# Patient Record
Sex: Male | Born: 1990 | Race: White | Hispanic: No | Marital: Married
Health system: Southern US, Community
[De-identification: ages and names within clinical notes are randomized; demographics above are authoritative.]

---
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
## 2013-05-21 ENCOUNTER — Ambulatory Visit: Payer: Self-pay | Admitting: Physician Assistant

## 2013-05-22 ENCOUNTER — Ambulatory Visit: Payer: Self-pay

## 2013-05-24 ENCOUNTER — Encounter: Payer: Self-pay | Admitting: *Deleted

## 2013-05-26 ENCOUNTER — Other Ambulatory Visit (HOSPITAL_COMMUNITY): Payer: Self-pay | Admitting: Urology

## 2013-05-26 ENCOUNTER — Ambulatory Visit (HOSPITAL_COMMUNITY)
Admission: RE | Admit: 2013-05-26 | Discharge: 2013-05-26 | Disposition: A | Discharge status: Home / Self Care | Payer: BC Managed Care – PPO | Source: Ambulatory Visit | Attending: Urology | Admitting: Urology

## 2013-05-26 DIAGNOSIS — N508 Other specified disorders of male genital organs: Secondary | ICD-10-CM | POA: Insufficient documentation

## 2013-05-26 DIAGNOSIS — N453 Epididymo-orchitis: Secondary | ICD-10-CM

## 2013-05-26 DIAGNOSIS — N433 Hydrocele, unspecified: Secondary | ICD-10-CM | POA: Insufficient documentation

## 2013-06-09 ENCOUNTER — Ambulatory Visit: Payer: Self-pay | Admitting: General Surgery

## 2014-05-03 IMAGING — CT CT PELVIS W/O CM
3 series · 17 of 46 positions shown, 20 images · non-contrast
Comparison: None.

CLINICAL DATA: Left inguinal hernia

EXAM:
CT PELVIS WITHOUT CONTRAST
TECHNIQUE: Multidetector CT imaging of the pelvis was performed following the
standard protocol without intravenous contrast.

[Series 3: soft tissue · axial · 0.66mm/px · z∈[-932,-624]mm · 13 of 171 slices shown, 16 images]
[im 11/171  soft-tissue]
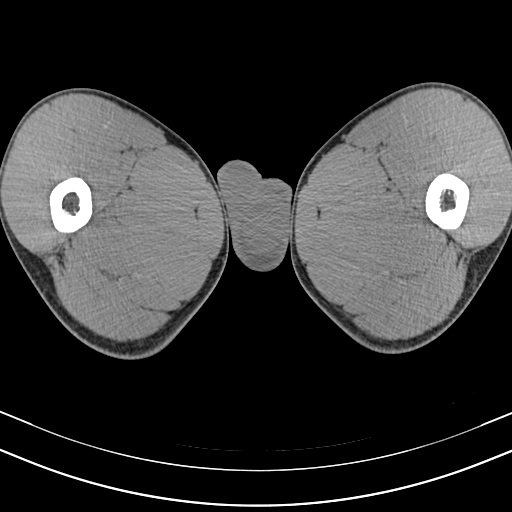
[im 11/171  bone]
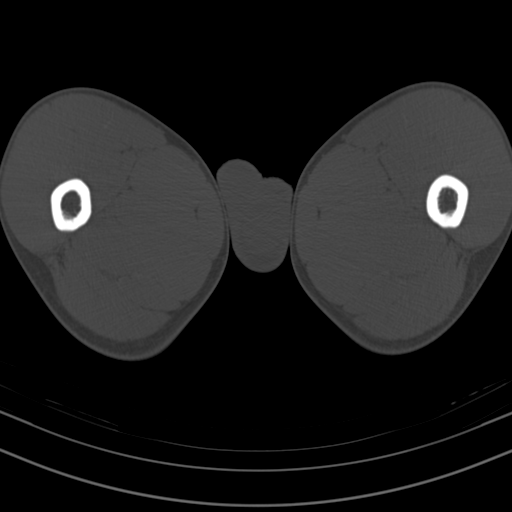
[im 28/171  soft-tissue]
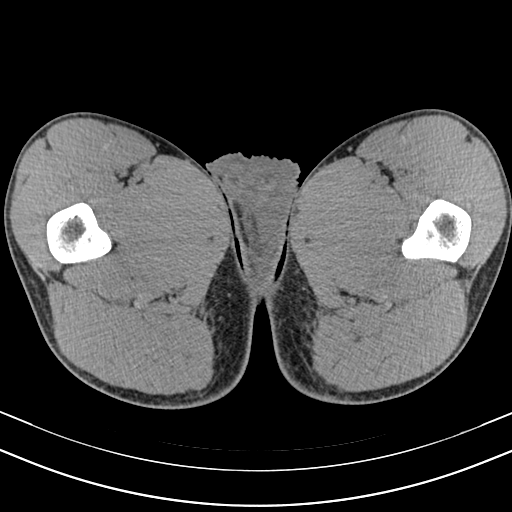
[im 44/171  soft-tissue]
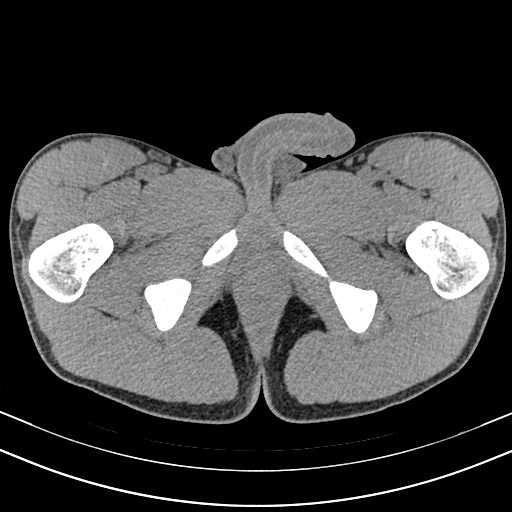
[im 61/171  soft-tissue]
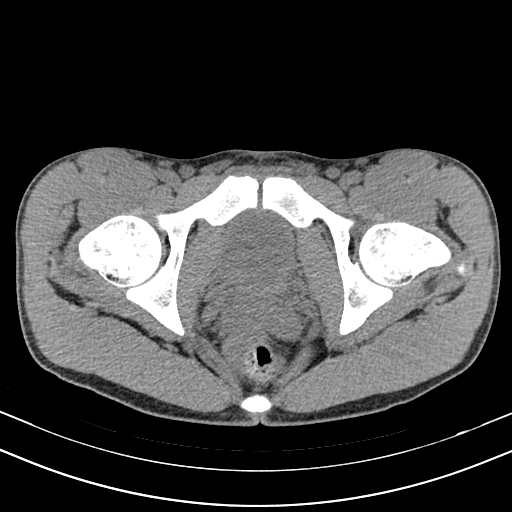
[im 77/171  soft-tissue]
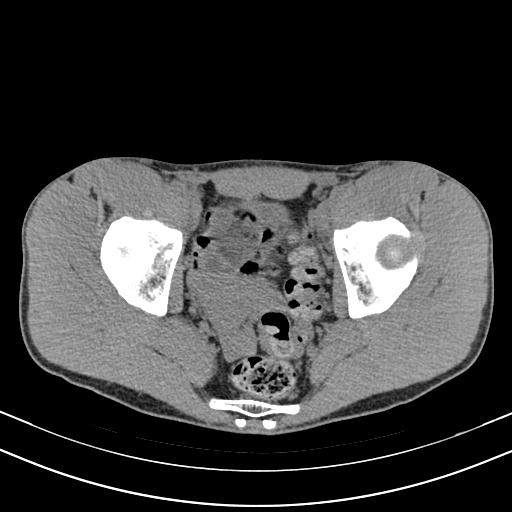
[im 94/171  soft-tissue]
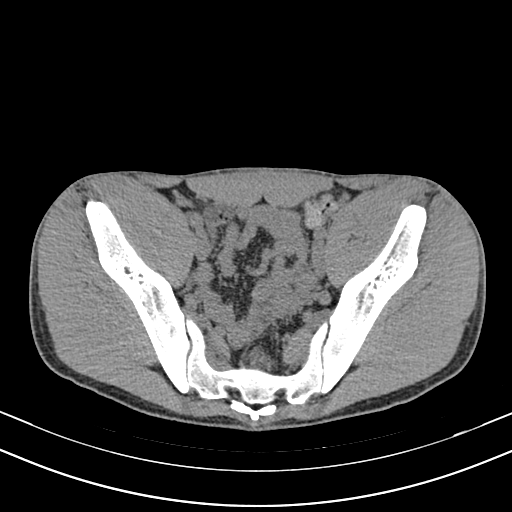
[im 110/171  soft-tissue]
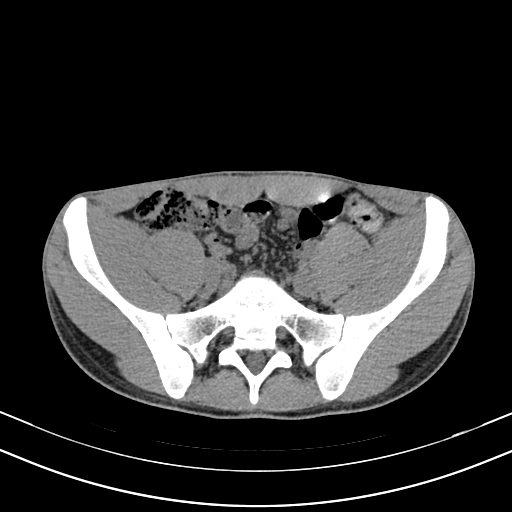
[im 127/171  soft-tissue]
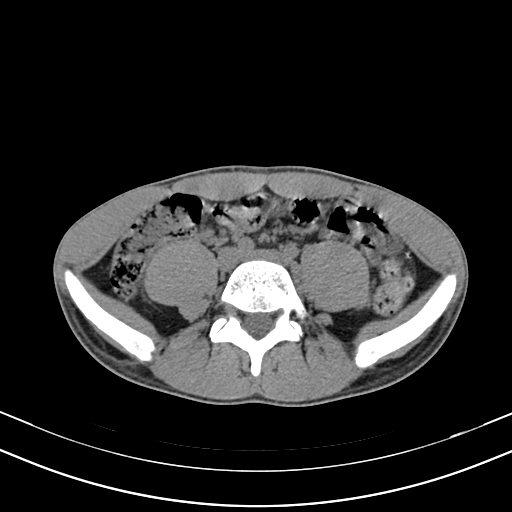
[im 143/171  soft-tissue]
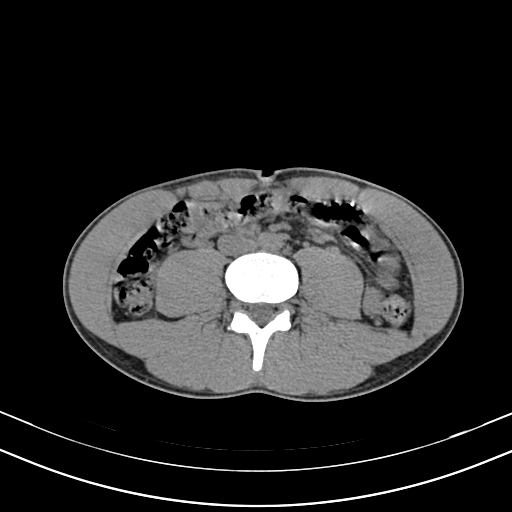
[im 143/171  bone]
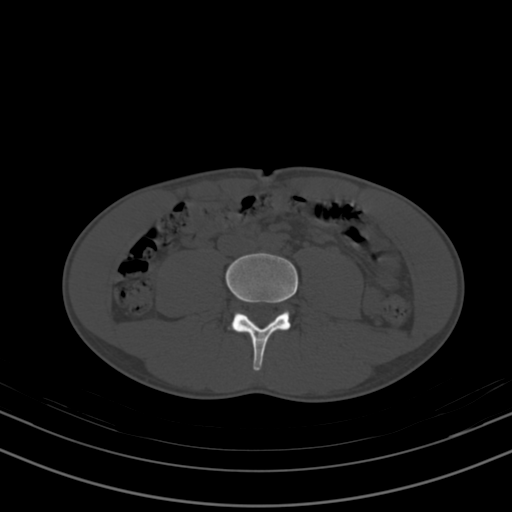
[im 149/171  lung]
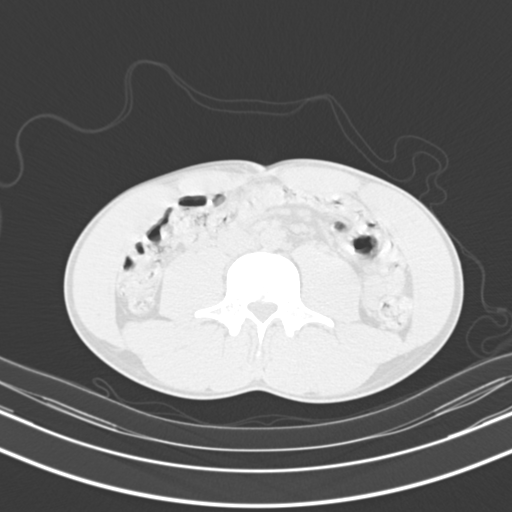
[im 154/171  lung]
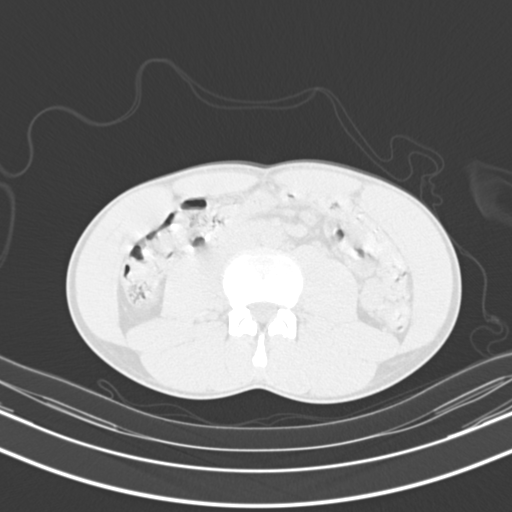
[im 160/171  soft-tissue]
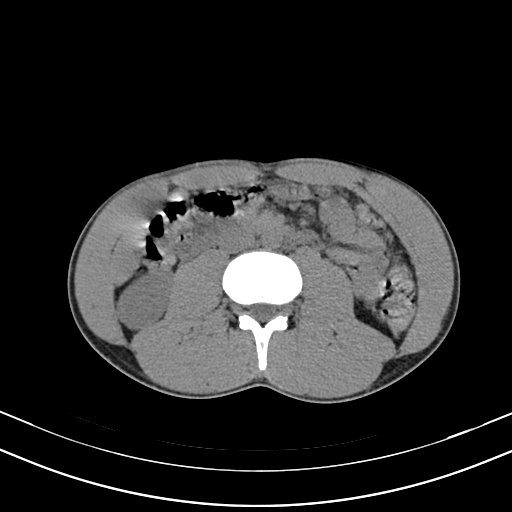
[im 160/171  lung]
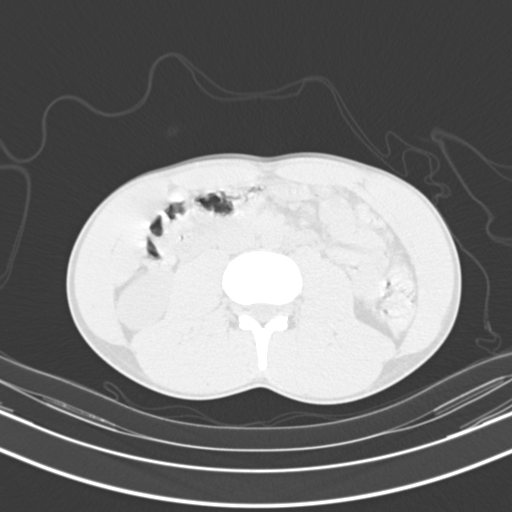
[im 165/171  lung]
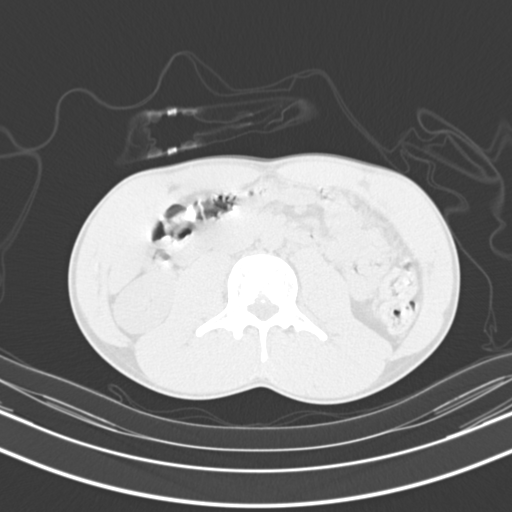

[Series 602: coronal · coronal · 0.67mm/px · 3 of 87 slices shown]
[im 29/87  soft-tissue]
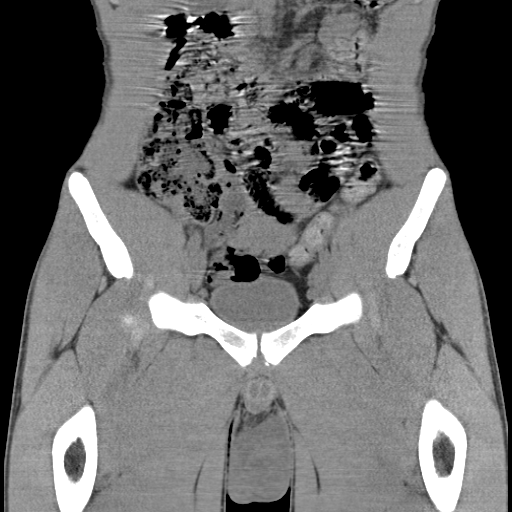
[im 39/87  soft-tissue]
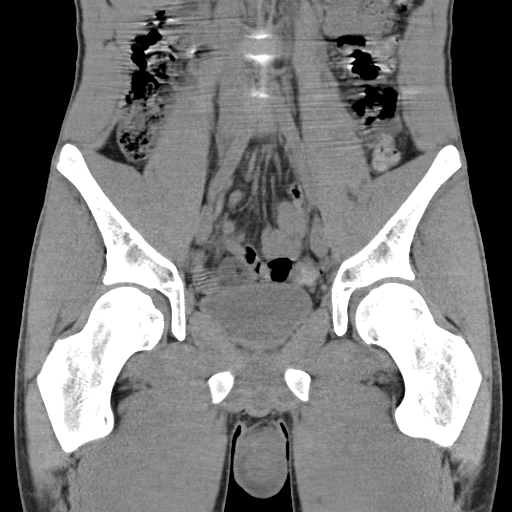
[im 48/87  soft-tissue]
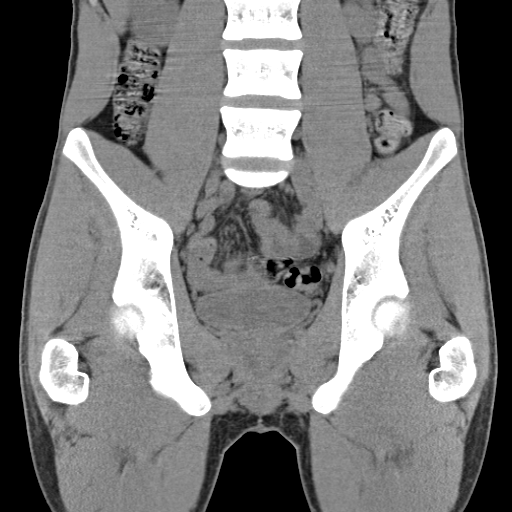

[Series 603: sagittal · sagittal · 0.67mm/px · 1 of 133 slices shown]
[im 45/133  soft-tissue]
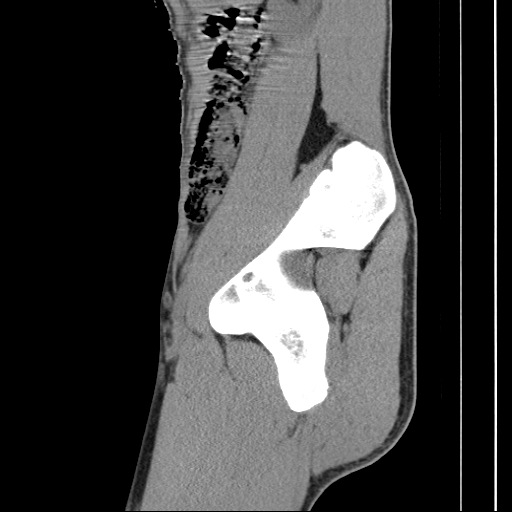

[17 of 46 positions shown; findings below may reference images not displayed]

FINDINGS: No acute fracture. No dislocation. No destructive bone lesion. No
evidence of inguinal hernia. Lack of intravenous and oral contrast
limits evaluation of the intrapelvic and intra-abdominal organs.
Bladder and prostate are grossly within normal limits.
IMPRESSION: No evidence of inguinal hernia.

## 2014-05-07 IMAGING — US US SCROTUM
1 series · 13 of 25 positions shown · non-contrast
Comparison: None.

CLINICAL DATA: Left scrotal swelling

EXAM:
ULTRASOUND OF SCROTUM
TECHNIQUE: Complete ultrasound examination of the testicles, epididymis, and
other scrotal structures was performed.

[Series 1: us scrotum · 0.06mm/px · 13 of 59 slices shown]
[im 1/59]
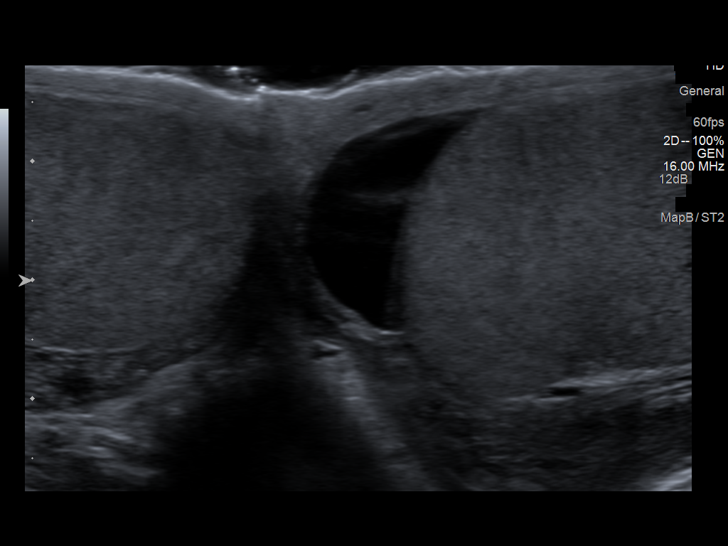
[im 5/59]
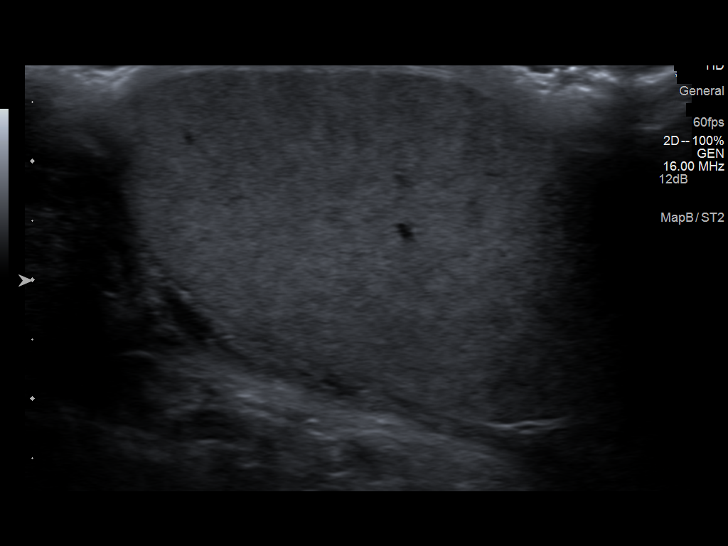
[im 10/59]
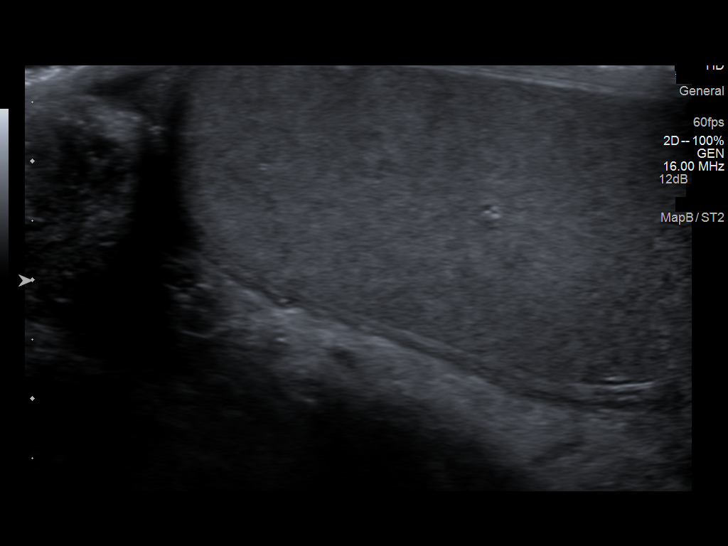
[im 15/59]
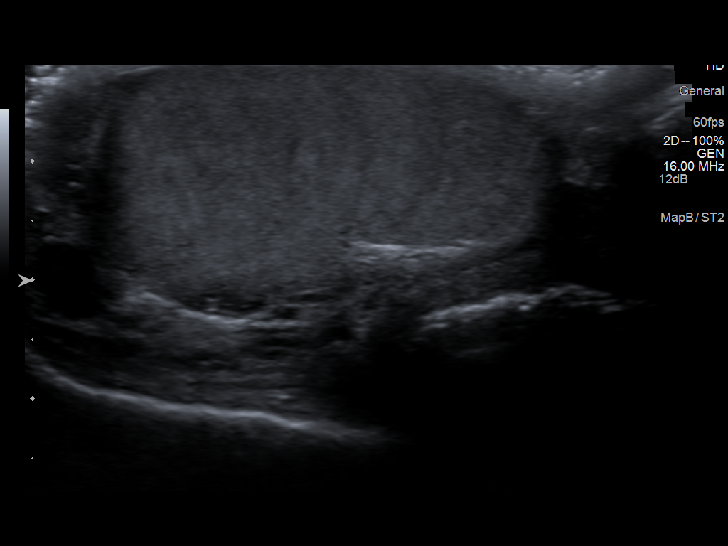
[im 20/59]
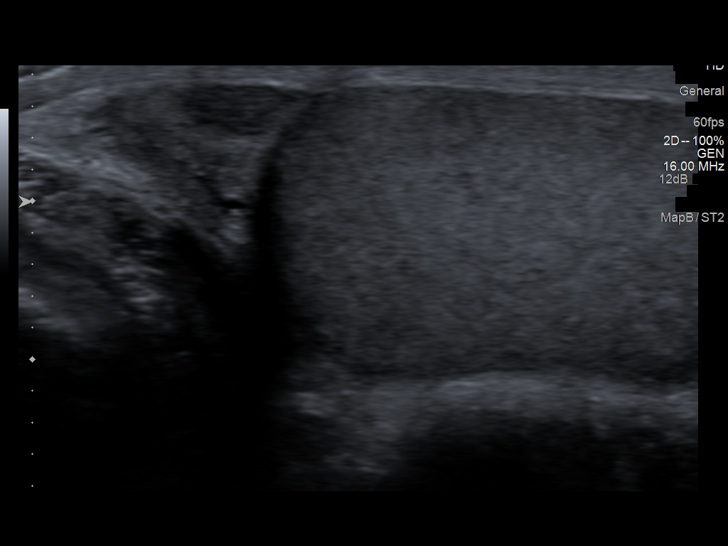
[im 25/59]
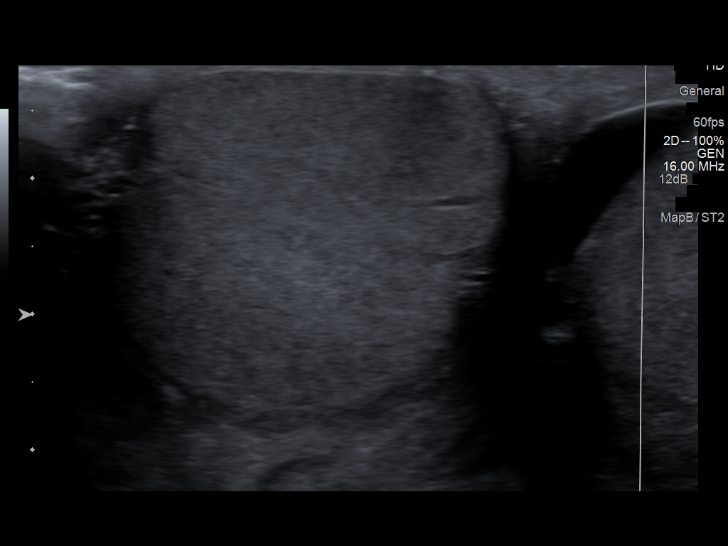
[im 30/59]
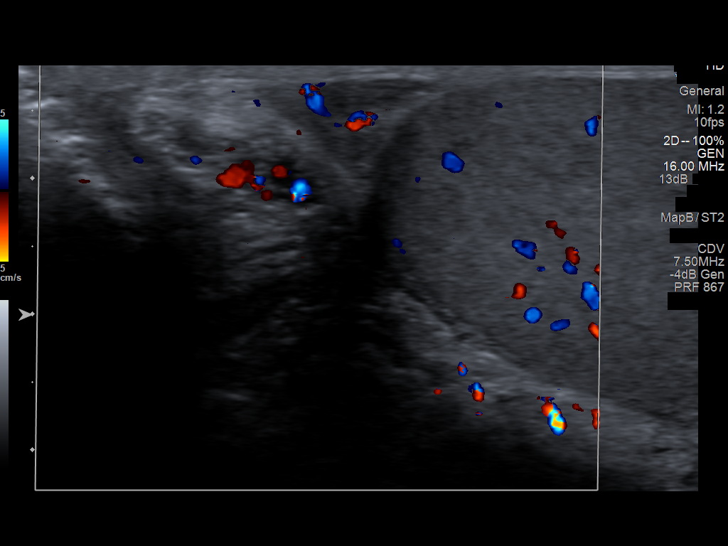
[im 34/59]
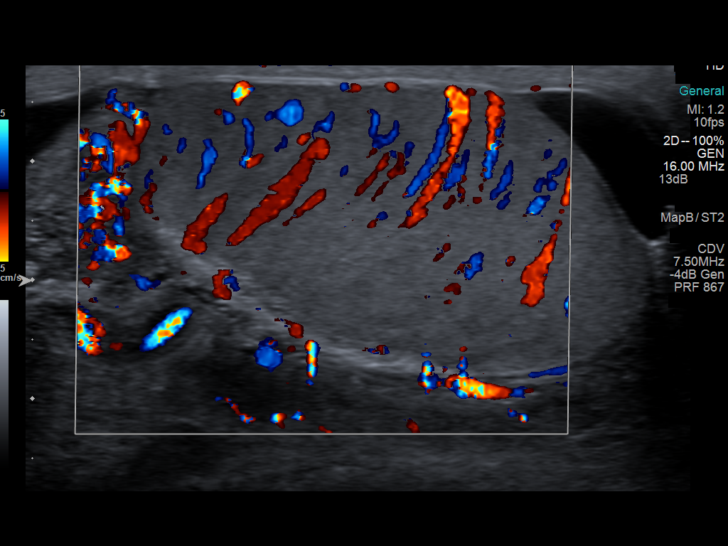
[im 39/59]
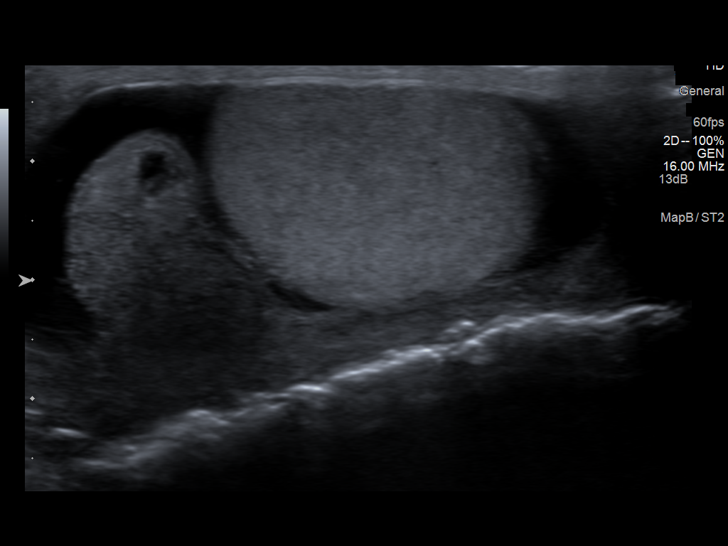
[im 44/59]
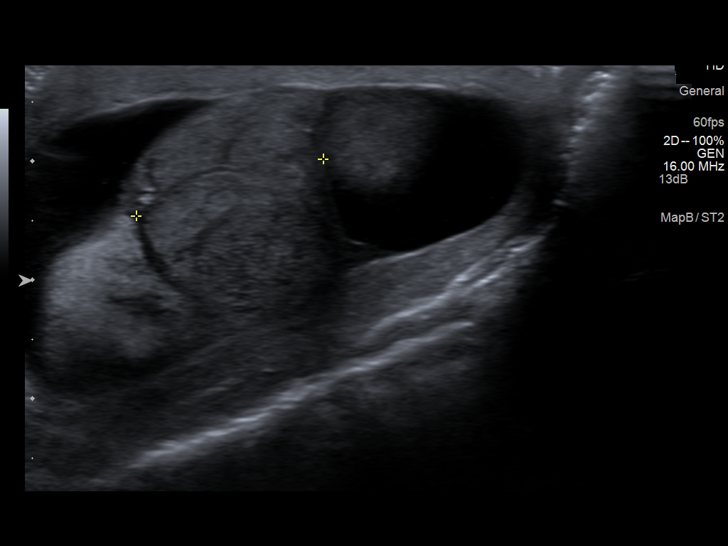
[im 49/59]
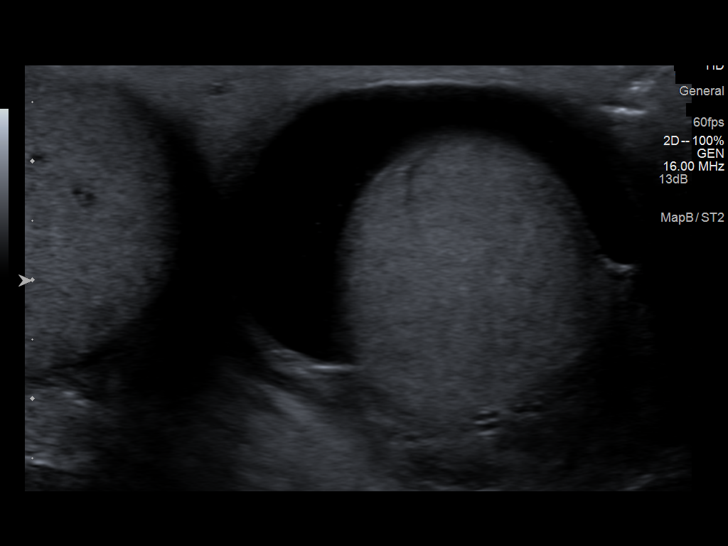
[im 54/59]
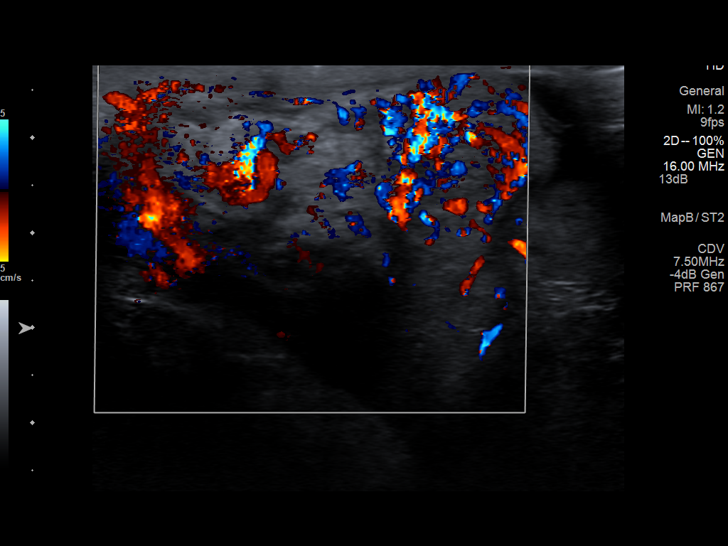
[im 59/59]
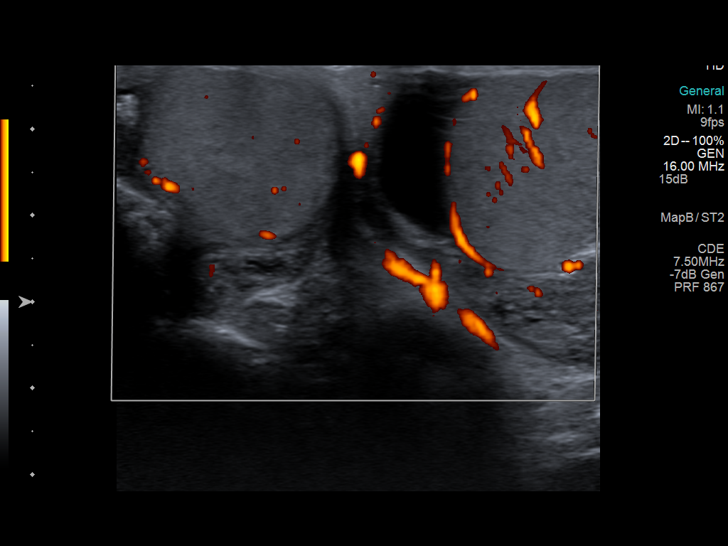

[13 of 25 positions shown; findings below may reference images not displayed]

FINDINGS: Right testicle

Measurements: 5 x 2.5 x 2.9 cm. No mass or microlithiasis
visualized. Diffuse color filling of vessels is appreciated.

Left testicle

Measurements: 4.5 x 2.4 x 2.3 cm. . No mass or microlithiasis
visualized. When compared to the right testicle there is slight
increased vascularity diffusely on the left.

Right epididymis:  Normal in size and appearance.

Left epididymis: The left epididymis is enlarged measuring 1.9 x
cm. The epididymis demonstrates a heterogeneous echotexture and
diffuse increased vascularity correlated with the right. A small to
moderate hydrocele identified on the left.

Hydrocele:  None visualized on the right.

Varicocele:  None visualized.
IMPRESSION: 1. Sonographic findings consistent with orchitis and epididymitis on
the left.
2. Small to moderate left hydrocele
3. The right hemiscrotum is unremarkable.
4. These results will be called to the ordering clinician or
representative by the Radiologist Assistant, and communication
documented in the PACS Dashboard.

## 2024-01-20 ENCOUNTER — Encounter: Payer: Self-pay | Admitting: Urology

## 2024-01-20 ENCOUNTER — Ambulatory Visit (INDEPENDENT_AMBULATORY_CARE_PROVIDER_SITE_OTHER): Payer: Self-pay | Admitting: Urology

## 2024-01-20 VITALS — BP 109/72 | HR 64 | Ht 70.0 in | Wt 145.0 lb

## 2024-01-20 DIAGNOSIS — Z3009 Encounter for other general counseling and advice on contraception: Secondary | ICD-10-CM

## 2024-01-20 MED ORDER — DIAZEPAM 5 MG PO TABS: 5.0000 mg | ORAL_TABLET | Freq: Once | ORAL | 0 refills | Status: AC | PRN

## 2024-01-20 NOTE — Patient Instructions (Addendum)
 Pre-Vasectomy Instructions  STOP all aspirin or blood thinners (Aspirin, Plavix, Coumadin, Warfarin, Motrin, Ibuprofen, Advil, Aleve, Naproxen, Naprosyn) for 7 days prior to the procedure.  If you have any questions about stopping these medications please contact your primary care physician or cardiologist.  Shave all hair from the upper scrotum on the day of the procedure.  This means just under the penis onto the scrotal sac.  The area shaved should measure about 2-3 inches around.  You may lather the scrotum with soap and water, and shave with a safety razor.  After shaving the area, thoroughly wash the penis and the scrotum, then shower or bathe to remove all the loose hairs.  If needed, wash the area again just before coming in for your Vasectomy.  It is recommended to have a light meal an hour or so prior to the procedure.  Bring a scrotal support (jock strap or suspensory, or tight jockey shorts or underwear).  Wear comfortable pants or shorts.  While the actual procedure usually takes about 45 minutes, you should be prepared to stay in the office for approximately one hour.  Bring someone with you to drive you home.  If you have any questions or concerns, please feel free to call the office at [phone].  Vasectomy Vasectomy is a procedure to cut and then tie or burn the ends of the vas deferens. The vas deferens is a tube that carries sperm from the testicle to the urethra. This procedure blocks sperm from being released during sex. This ensures that sperm does not go into the vagina. A vasectomy does not affect your ability to have sex or your desire for sex. Also, it does not prevent sexually transmitted infections, or STIs. Vasectomy is a permanent and effective form of birth control. You should have a vasectomy only when you and your partner are sure you do not want children in the future. Do not get this procedure when you are stressed, such as after divorce or pregnancy  loss. Tell a health care provider about: Any allergies you have. All medicines you are taking. These include vitamins, herbs, eye drops, creams, and over-the-counter medicines. Any problems you or family members have had with anesthesia. Any bleeding problems you have. Any surgeries you have had. Any medical conditions you have. What are the risks? Your provider will talk with you about risks. These may include: Infection. Bleeding and swelling of the scrotum. The scrotum is the sac that contains the testicles. Allergies to medicines. Failure of the procedure to prevent pregnancy. There is a very small chance that the tied or burned parts of the vas deferens may reconnect. If this happens, you could still make a person pregnant. Pain in the scrotum that goes on after you heal from the procedure. What happens before the procedure? Medicines Ask your health care provider about: Changing or stopping your regular medicines. These include any diabetes medicines or blood thinners you take. Taking medicines such as aspirin and ibuprofen. These medicines can thin your blood. Do not take them unless your provider tells you to take them. Taking over-the-counter medicines, vitamins, herbs, and supplements. You may be told to take a sedative a few hours before the procedure. A sedative helps you relax. Surgery safety Ask your provider: How your surgery site will be marked. What steps will be taken to help prevent infection. These steps may include: Removing hair at the surgery site. Washing skin with a soap that kills germs. Taking antibiotics. General instructions Do  not use any products that contain nicotine or tobacco for at least 4 weeks before the procedure. These products include cigarettes, chewing tobacco, and vaping devices, such as e-cigarettes. If you need help quitting, ask your provider. If you'll be going home right after the procedure, plan to have a responsible adult: Take you  home from the hospital or clinic. You'll not be allowed to drive. Care for you for the time you are told. What happens during the procedure?  You may be given: A sedative. This helps you relax. You may also be told to take this a few hours before the procedure. Anesthesia. This keeps you from feeling pain. It will numb certain areas of your body. Your provider will feel for your vas deferens. To get to the vas deferens, your provider may: Make a very small cut, or incision, in your scrotum. Make a hole by piercing the scrotum. Your vas deferens will be pulled out of your scrotum and cut. To close it, the cut ends of the vas deferens will be tied or burned. The vas deferens will be put back into your scrotum. The cut or the hole in the scrotum will be closed with stitches. The stitches will dissolve and will not need to be removed. The procedure will be done again on the other side of your scrotum. The procedure may vary among providers and hospitals. What happens after the procedure? You will be monitored to make sure that you do not have problems. You will be asked not to ejaculate for at least 1 week after the procedure, or for as long as you are told. You will need to use another form of birth control for 2-4 months after the procedure. Do this until your provider confirms that there's no sperm in your semen. You may be given something to wear to support your scrotum. This includes a jockstrap or underwear with a pouch. If you were given a sedative during your procedure, do not drive or use machines until your provider says that it's safe. This information is not intended to replace advice given to you by your health care provider. Make sure you discuss any questions you have with your health care provider. Document Revised: 06/18/2022 Document Reviewed: 06/18/2022 Elsevier Patient Education  2024 [employer].

## 2024-01-20 NOTE — Progress Notes (Signed)
   01/20/24 9:44 AM   Elsie KANDICE Rower [birth year] [id number]  CC: Discuss vasectomy  HPI: Healthy 33 year old male with 3 children, does not desire any further biologic pregnancies and interested in vasectomy.  He denies any family history of prostate cancer   PMH: History reviewed. No pertinent past medical history.  Surgical History: History reviewed. No pertinent surgical history.    Family History: History reviewed. No pertinent family history.  Social History:  has no history on file for tobacco use, alcohol use, and drug use.  Physical Exam: BP 109/72 (BP Location: Left Arm, Patient Position: Sitting, Cuff Size: Normal)   Pulse 64   Ht 5' 10 (1.778 m)   Wt 145 lb (65.8 kg)   SpO2 100%   BMI 20.81 kg/m    Constitutional:  Alert and oriented, No acute distress. Cardiovascular: No clubbing, cyanosis, or edema. Respiratory: Normal respiratory effort, no increased work of breathing. GI: Abdomen is soft, nontender, nondistended, no abdominal masses GU: Vas deferens easily palpable bilaterally, no testicular masses  Assessment & Plan:   33 year old male interested in vasectomy for permanent sterilization.  We discussed the risks and benefits of vasectomy at length.  Vasectomy is intended to be a permanent form of contraception, and does not produce immediate sterility.  Following vasectomy another form of contraception is required until vas occlusion is confirmed by a post-vasectomy semen analysis obtained 2-3 months after the procedure.  Even after vas occlusion is confirmed, vasectomy is not 100% reliable in preventing pregnancy, and the failure rate is approximately 04/1998.  Repeat vasectomy is required in less than 1% of patients.  He should refrain from ejaculation for 1 week after vasectomy.  Options for fertility after vasectomy include vasectomy reversal, and sperm retrieval with in vitro fertilization or ICSI.  These options are not always successful and may be  expensive.  Finally, there are other permanent and non-permanent alternatives to vasectomy available. There is no risk of erectile dysfunction, and the volume of semen will be similar to prior, as the majority of the ejaculate is from the prostate and seminal vesicles.   The procedure takes ~20 minutes.  We recommend patients take 5-10 mg of Valium  30 minutes prior, and he will need a driver post-procedure.  Local anesthetic is injected into the scrotal skin and a small segment of the vas deferens is removed, and the ends occluded. The complication rate is approximately 1-2%, and includes bleeding, infection, and development of chronic scrotal pain.  PLAN: Schedule vasectomy Valium  sent to pharmacy  Redell Burnet, MD 01/20/2024  [facility] [address], [address] [place], [place] [address] [phone]

## 2024-04-02 ENCOUNTER — Encounter: Admitting: Urology

## 2024-04-08 ENCOUNTER — Ambulatory Visit (INDEPENDENT_AMBULATORY_CARE_PROVIDER_SITE_OTHER): Admitting: Urology

## 2024-04-08 DIAGNOSIS — Z9852 Vasectomy status: Secondary | ICD-10-CM

## 2024-04-08 DIAGNOSIS — Z3009 Encounter for other general counseling and advice on contraception: Secondary | ICD-10-CM | POA: Diagnosis not present

## 2024-04-08 NOTE — Patient Instructions (Signed)

## 2024-04-08 NOTE — Progress Notes (Signed)
 VASECTOMY PROCEDURE NOTE:  The patient was taken to the minor procedure room and placed in the supine position. His genitals were prepped and draped in the usual sterile fashion. The right vas deferens was brought up to the skin of the right upper scrotum. The skin overlying it was anesthetized with 1% lidocaine without epinephrine, anesthetic was also injected alongside the vas deferens in the direction of the inguinal canal. The no scalpel vasectomy instrument was used to make a small perforation in the scrotal skin. The vasectomy clamp was used to grasp the vas deferens. It was carefully dissected free from surrounding structures. A 1cm segment of the vas was removed, and the cut ends of the mucosa were cauterized. No significant bleeding was noted. The vas deferens was returned to the scrotum. The skin incision was closed with a simple interrupted stitch of 4-0 chromic.  Attention was then turned to the left side. The left vasectomy was performed in the same exact fashion. Sterile dressings were placed over each incision. The patient tolerated the procedure well.  IMPRESSION/DIAGNOSIS: The patient is a 33 year old gentleman who underwent a vasectomy today. Post-procedure instructions were reviewed. I stressed the importance of continuing to use birth control until he provides a semen specimen more than 2 months from now that demonstrates azoospermia.  We discussed return precautions including fever over 101, significant bleeding or hematoma, or uncontrolled pain. I also stressed the importance of avoiding strenuous activity for one week, no sexual activity or ejaculations for 5 days, intermittent icing over the next 48 hours, and scrotal support.   PLAN: The patient will be advised of his semen analysis results when available.  Redell Burnet, MD 04/08/2024

## 2024-07-07 ENCOUNTER — Other Ambulatory Visit
